# Patient Record
Sex: Male | Born: 1975 | Race: White | Hispanic: No | Marital: Married | Smoking: Never smoker
Health system: Southern US, Community
[De-identification: ages and names within clinical notes are randomized; demographics above are authoritative.]

---

## 1998-07-21 ENCOUNTER — Emergency Department (HOSPITAL_COMMUNITY): Admission: EM | Admit: 1998-07-21 | Discharge: 1998-07-21 | Payer: Self-pay | Admitting: Emergency Medicine

## 1998-07-21 ENCOUNTER — Encounter: Payer: Self-pay | Admitting: Emergency Medicine

## 1999-10-24 ENCOUNTER — Emergency Department (HOSPITAL_COMMUNITY): Admission: EM | Admit: 1999-10-24 | Discharge: 1999-10-24 | Payer: Self-pay | Admitting: Emergency Medicine

## 1999-10-24 ENCOUNTER — Encounter: Payer: Self-pay | Admitting: Emergency Medicine

## 2014-07-28 ENCOUNTER — Emergency Department (HOSPITAL_COMMUNITY): Payer: BC Managed Care – PPO

## 2014-07-28 ENCOUNTER — Encounter (HOSPITAL_COMMUNITY): Payer: Self-pay | Admitting: Emergency Medicine

## 2014-07-28 ENCOUNTER — Emergency Department (HOSPITAL_COMMUNITY)
Admission: EM | Admit: 2014-07-28 | Discharge: 2014-07-28 | Disposition: A | Discharge status: Home / Self Care | Payer: BC Managed Care – PPO | Attending: Emergency Medicine | Admitting: Emergency Medicine

## 2014-07-28 DIAGNOSIS — N508 Other specified disorders of male genital organs: Secondary | ICD-10-CM | POA: Insufficient documentation

## 2014-07-28 DIAGNOSIS — IMO0002 Reserved for concepts with insufficient information to code with codable children: Secondary | ICD-10-CM

## 2014-07-28 LAB — URINALYSIS, ROUTINE W REFLEX MICROSCOPIC
Bilirubin Urine: NEGATIVE
Glucose, UA: NEGATIVE mg/dL
Hgb urine dipstick: NEGATIVE
Ketones, ur: NEGATIVE mg/dL
Leukocytes, UA: NEGATIVE
Nitrite: NEGATIVE
Protein, ur: NEGATIVE mg/dL
Specific Gravity, Urine: 1.025 (ref 1.005–1.030)
Urobilinogen, UA: 0.2 mg/dL (ref 0.0–1.0)
pH: 5 (ref 5.0–8.0)

## 2014-07-28 MED ORDER — HYDROCODONE-ACETAMINOPHEN 5-325 MG PO TABS: 1.0000 | ORAL_TABLET | Freq: Four times a day (QID) | ORAL | Status: AC | PRN

## 2014-07-28 MED ORDER — HYDROMORPHONE HCL 1 MG/ML IJ SOLN
0.5000 mg | Freq: Once | INTRAMUSCULAR | Status: AC
  Administered 2014-07-28: 0.5 mg via INTRAVENOUS
  Filled 2014-07-28: qty 1

## 2014-07-28 MED ORDER — NAPROXEN 500 MG PO TABS: 500.0000 mg | ORAL_TABLET | Freq: Two times a day (BID) | ORAL | Status: AC

## 2014-07-28 MED ORDER — ONDANSETRON HCL 4 MG/2ML IJ SOLN
4.0000 mg | Freq: Once | INTRAMUSCULAR | Status: AC
  Administered 2014-07-28: 4 mg via INTRAVENOUS
  Filled 2014-07-28: qty 2

## 2014-07-28 NOTE — ED Provider Notes (Signed)
CSN: [id number]     Arrival date & time 07/28/14  1123 History   First MD Initiated Contact with Patient 07/28/14 1407     Chief Complaint  Patient presents with  . Testicle Pain    radiating to back     (Consider location/radiation/quality/duration/timing/severity/associated sxs/prior Treatment) Patient is a 38 y.o. male presenting with male genitourinary complaint. The history is provided by the patient. No language interpreter was used.  Male GU Problem Presenting symptoms: scrotal pain   Presenting symptoms: no dysuria, no penile discharge and no penile pain   Scrotal pain:    Affected testicle:  Both   Severity:  Moderate   Onset quality:  Sudden   Timing:  Intermittent   Progression:  Worsening   Chronicity:  New Context: not after injury, not after urination, not during intercourse and not during urination   Relieved by:  Elevation of scrotum Worsened by:  Nothing tried Ineffective treatments:  None tried Associated symptoms: no abdominal pain, no diarrhea, no fever, no flank pain, no hematuria, no nausea, no penile swelling, no urinary frequency and no vomiting   Risk factors: does not have multiple sexual partners and no STD exposure     History reviewed. No pertinent past medical history. History reviewed. No pertinent past surgical history. No family history on file. History  Substance Use Topics  . Smoking status: Never Smoker   . Smokeless tobacco: Not on file  . Alcohol Use: Yes     Comment: occ    Review of Systems  Constitutional: Negative for fever.  HENT: Negative for congestion, rhinorrhea and sore throat.   Respiratory: Negative for cough and shortness of breath.   Cardiovascular: Negative for chest pain.  Gastrointestinal: Negative for nausea, vomiting, abdominal pain and diarrhea.  Genitourinary: Positive for testicular pain. Negative for dysuria, frequency, hematuria, flank pain, discharge, penile swelling and penile pain.  Skin: Negative for  rash.  Neurological: Negative for syncope, light-headedness and headaches.  All other systems reviewed and are negative.     Allergies  Review of patient's allergies indicates no known allergies.  Home Medications   Prior to Admission medications   Not on File   BP 142/77  Pulse 98  Temp(Src) 99.3 F (37.4 C) (Oral)  Resp 16  Ht 5\' 6"  (1.676 m)  Wt 240 lb (108.863 kg)  BMI 38.76 kg/m2  SpO2 99% Physical Exam  Nursing note and vitals reviewed. Constitutional: He is oriented to person, place, and time. He appears well-developed and well-nourished.  HENT:  Head: Normocephalic and atraumatic.  Right Ear: External ear normal.  Left Ear: External ear normal.  Eyes: EOM are normal.  Neck: Normal range of motion. Neck supple.  Cardiovascular: Normal rate, regular rhythm and intact distal pulses.  Exam reveals no gallop and no friction rub.   No murmur heard. Pulmonary/Chest: Effort normal and breath sounds normal. No respiratory distress. He has no wheezes. He has no rales. He exhibits no tenderness.  Abdominal: Soft. Bowel sounds are normal. He exhibits no distension. There is no tenderness. There is no rebound.  Genitourinary:  Normal lie of testes, no hernia. No penile discharge or tenderness. Tenderness to lower pole of testes.  Musculoskeletal: Normal range of motion. He exhibits no edema and no tenderness.  Lymphadenopathy:    He has no cervical adenopathy.  Neurological: He is alert and oriented to person, place, and time.  Skin: Skin is warm. No rash noted.  Psychiatric: He has a normal mood and affect.  His behavior is normal.    ED Course  Procedures (including critical care time) Labs Review Labs Reviewed  URINALYSIS, ROUTINE W REFLEX MICROSCOPIC    Imaging Review No results found.   EKG Interpretation None      MDM   Final diagnoses:  None    2:25 PM Pt is a 38 y.o. male with no pertinent PMHX who presents to the ED with bilateral testicular  pain. Sudden onset worsening today while at work.  Has been off and on pain for the past week. Sharp in nature without radiation. No nausea, vomiting or diarrhea. No hematuria, no dysuria. No new sexual partners or recent sexual intercourse. No history of STDs. No urinary symptoms  On exam: well appearing, no hernia, no abdominal tenderness. No penile tenderness. No lymphadenopathy, normal lie of testes with normal cremasteric reflex. Concern for testicular torsion, possible intermittent testicular torsion. Plan to obtain UA and US scrotum to rule out torsion. Low suspicion for STD. Possible epididymitis  UA no evidence of UTI  Patient care transferred to Dr. Ladona Ridgelaylor pending results of US   Labs and imaging reviewed by myself and considered in medical decision making if ordered.  Imaging interpreted by radiology. Pt was discussed with my attending, Dr. Salli Realocherty     Kassy Mcenroe Peter Shanara Schnieders, MD 07/28/14 [phone]

## 2014-07-28 NOTE — ED Notes (Signed)
Pt c/o bil testicular pain that radiates to lower back.  Pt states pain increased today while shoveling.  Denies swelling, warmth or redness to area.  Denies penile discharge or changes in bowel or bladder habits.

## 2014-07-28 NOTE — ED Notes (Signed)
EDP with pt at this time 

## 2014-07-29 NOTE — ED Provider Notes (Signed)
Medical screening examination/treatment/procedure(s) were conducted as a shared visit with resident-physician practitioner(s) and myself.  I personally evaluated the patient during the encounter.  Pt is a 38 y.o. male with pmhx as above presenting with about one week of bilateral testicular pain, worse today..  Pt found to have normal cremasteric reflex, no appreciable edema or redness on my physical exam. No hernias. Bilateral testicular ultrasound will be ordered, but doubt torsion given the pain is bilateral..    Toy CookeyMegan Domonic Kimball, MD 07/29/14 1710

## 2015-02-02 ENCOUNTER — Ambulatory Visit
Admit: 2015-02-02 | Disposition: A | Discharge status: Home / Self Care | Payer: Self-pay | Attending: Family Medicine | Admitting: Family Medicine

## 2015-02-02 LAB — RAPID INFLUENZA A&B ANTIGENS (ARMC ONLY)

## 2018-06-04 DIAGNOSIS — R112 Nausea with vomiting, unspecified: Secondary | ICD-10-CM | POA: Diagnosis not present

## 2018-06-04 DIAGNOSIS — A498 Other bacterial infections of unspecified site: Secondary | ICD-10-CM | POA: Diagnosis not present

## 2018-06-04 DIAGNOSIS — R109 Unspecified abdominal pain: Secondary | ICD-10-CM | POA: Diagnosis not present

## 2018-06-04 DIAGNOSIS — D72829 Elevated white blood cell count, unspecified: Secondary | ICD-10-CM | POA: Diagnosis not present

## 2018-06-05 DIAGNOSIS — R109 Unspecified abdominal pain: Secondary | ICD-10-CM | POA: Diagnosis not present

## 2018-06-05 DIAGNOSIS — R112 Nausea with vomiting, unspecified: Secondary | ICD-10-CM | POA: Diagnosis not present

## 2018-06-05 DIAGNOSIS — D72829 Elevated white blood cell count, unspecified: Secondary | ICD-10-CM | POA: Diagnosis not present

## 2018-06-05 DIAGNOSIS — A498 Other bacterial infections of unspecified site: Secondary | ICD-10-CM | POA: Diagnosis not present

## 2018-08-19 ENCOUNTER — Emergency Department (HOSPITAL_COMMUNITY)
Admission: EM | Admit: 2018-08-19 | Discharge: 2018-08-19 | Disposition: A | Discharge status: Home / Self Care | Payer: BLUE CROSS/BLUE SHIELD | Attending: Emergency Medicine | Admitting: Emergency Medicine

## 2018-08-19 ENCOUNTER — Emergency Department (HOSPITAL_COMMUNITY): Payer: BLUE CROSS/BLUE SHIELD

## 2018-08-19 DIAGNOSIS — Y929 Unspecified place or not applicable: Secondary | ICD-10-CM | POA: Diagnosis not present

## 2018-08-19 DIAGNOSIS — Y990 Civilian activity done for income or pay: Secondary | ICD-10-CM | POA: Insufficient documentation

## 2018-08-19 DIAGNOSIS — S62667B Nondisplaced fracture of distal phalanx of left little finger, initial encounter for open fracture: Secondary | ICD-10-CM | POA: Insufficient documentation

## 2018-08-19 DIAGNOSIS — S67197A Crushing injury of left little finger, initial encounter: Secondary | ICD-10-CM | POA: Diagnosis present

## 2018-08-19 DIAGNOSIS — W319XXA Contact with unspecified machinery, initial encounter: Secondary | ICD-10-CM | POA: Insufficient documentation

## 2018-08-19 DIAGNOSIS — Y939 Activity, unspecified: Secondary | ICD-10-CM | POA: Diagnosis not present

## 2018-08-19 MED ORDER — SODIUM CHLORIDE 0.9 % IV BOLUS
500.0000 mL | Freq: Once | INTRAVENOUS | Status: AC
  Administered 2018-08-19: 500 mL via INTRAVENOUS

## 2018-08-19 MED ORDER — CEFAZOLIN SODIUM-DEXTROSE 2-4 GM/100ML-% IV SOLN
2.0000 g | Freq: Once | INTRAVENOUS | Status: AC
  Administered 2018-08-19: 2 g via INTRAVENOUS
  Filled 2018-08-19: qty 100

## 2018-08-19 MED ORDER — CEPHALEXIN 500 MG PO CAPS: 500.0000 mg | ORAL_CAPSULE | Freq: Four times a day (QID) | ORAL | 0 refills | Status: AC

## 2018-08-19 MED ORDER — OXYCODONE-ACETAMINOPHEN 5-325 MG PO TABS
1.0000 | ORAL_TABLET | Freq: Once | ORAL | Status: AC
  Administered 2018-08-19: 1 via ORAL
  Filled 2018-08-19: qty 1

## 2018-08-19 NOTE — ED Provider Notes (Signed)
Dalton Hodges [facility] EMERGENCY DEPARTMENT Provider Note   CSN: [id number] Arrival date & time: 08/19/18  1434     History   Chief Complaint Chief Complaint  Patient presents with  . Finger Injury    HPI Dalton Hodges is a 42 y.o. male presenting today for left fifth finger injury that occurred at work today.  Patient states that at 12:15 PM today his finger got caught in the machine at work crushing the tip of his left fifth finger tip.  Patient endorsed immediate sharp severe pain that has somewhat decreased in severity since onset.  Patient presented to the wellness center and received a Tdap shot prior to being sent to the emergency department for further treatment.  At this time patient endorsing a moderate intensity throbbing pain that is constant and worse with movement of the finger.  Patient denies any other injury today.  Patient states that he is an otherwise healthy 42 year old male, only daily medication use is oxycodone for previous right tricep injury.  HPI  No past medical history on file.  There are no active problems to display for this patient.   No past surgical history on file.      Home Medications    Prior to Admission medications   Medication Sig Start Date End Date Taking? Authorizing Provider  cephALEXin (KEFLEX) 500 MG capsule Take 1 capsule (500 mg total) by mouth 4 (four) times daily for 7 days. 08/19/18 08/26/18  Harlene Salts A, PA-C  HYDROcodone-acetaminophen (NORCO/VICODIN) 5-325 MG per tablet Take 1 tablet by mouth every 6 (six) hours as needed (for breakthrough pain). 07/28/14   Abagail Kitchens, MD  phentermine 37.5 MG capsule Take 37.5 mg by mouth every morning.    [provider]    Family History No family history on file.  Social History Social History   Tobacco Use  . Smoking status: Never Smoker  Substance Use Topics  . Alcohol use: Yes    Comment: occ  . Drug use: No     Allergies   Patient has  no known allergies.   Review of Systems Review of Systems  Constitutional: Negative.  Negative for chills and fever.  HENT: Negative.  Negative for congestion and rhinorrhea.   Eyes: Negative.  Negative for visual disturbance.  Respiratory: Negative.  Negative for shortness of breath.   Cardiovascular: Negative.  Negative for chest pain.  Gastrointestinal: Negative.  Negative for abdominal pain, nausea and vomiting.  Genitourinary: Negative.  Negative for dysuria.  Musculoskeletal: Positive for arthralgias. Negative for myalgias.       (Left finger DIP pain)  Skin: Positive for wound.  Neurological: Negative.  Negative for syncope, weakness and numbness.   Physical Exam Updated Vital Signs BP (!) 134/92 (BP Location: Right Arm)   Pulse 98   Temp 97.9 F (36.6 C)   Resp 18   SpO2 100%   Physical Exam  Constitutional: He appears well-developed and well-nourished. No distress.  HENT:  Head: Normocephalic and atraumatic.  Right Ear: External ear normal.  Left Ear: External ear normal.  Nose: Nose normal.  Eyes: Pupils are equal, round, and reactive to light. EOM are normal.  Neck: Trachea normal and normal range of motion. No tracheal deviation present.  Pulmonary/Chest: Effort normal. No respiratory distress.  Abdominal: Soft. There is no tenderness. There is no rebound and no guarding.  Musculoskeletal: Normal range of motion.       Right elbow: Normal.      Right  wrist: Normal.       Left forearm: Normal.       Left hand: He exhibits deformity. He exhibits normal range of motion and normal capillary refill. Normal sensation noted. Normal strength noted.  Left hand: With avulsion of left fourth fingertip.  No active bleeding. Tenderness to palpation directly over the wound.  No snuffbox tenderness to palpation. No tenderness to palpation over flexor sheath.  Finger adduction/abduction intact with 5/5 strength.  Thumb opposition intact. Full active and resisted ROM to  flexion/extension at wrist, MCP, PIP and DIP of all fingers.  Patient endorses increased pain with movement of the left fifth finger.   FDS/FDP intact. Grip 5/5 strength.  Radial artery 2+ with <2sec cap refill in all fingers.  Sensation intact to light-tough in median/ulnar/radial distributions.  Neurological: He is alert. GCS eye subscore is 4. GCS verbal subscore is 5. GCS motor subscore is 6.  Speech is clear and goal oriented, follows commands Major Cranial nerves without deficit, no facial droop Normal strength in upper and lower extremities bilaterally including dorsiflexion and plantar flexion, strong and equal grip strength increased pain with movement of left fifth finger. Sensation normal to light touch Moves extremities without ataxia, coordination intact Normal gait  Skin: Skin is warm and dry. Capillary refill takes less than 2 seconds.  Avulsion injury to left fifth finger, please see picture below.  Psychiatric: He has a normal mood and affect. His behavior is normal.         ED Treatments / Results  Labs (all labs ordered are listed, but only abnormal results are displayed) Labs Reviewed - No data to display  EKG None  Radiology Dg Finger Little Left  Result Date: 08/19/2018 CLINICAL DATA:  Machinery injury EXAM: LEFT LITTLE FINGER 2+V COMPARISON:  None. FINDINGS: Laceration of the medial aspect of the distal left little finger. There is an absent fragment of the tuft of the little finger. IMPRESSION: Soft tissue and osseous avulsion the medial aspect of the distal left little finger. Electronically Signed   By: Deatra Robinson M.D.   On: 08/19/2018 15:51    Procedures Procedures (including critical care time)  Medications Ordered in ED Medications  sodium chloride 0.9 % bolus 500 mL (500 mLs Intravenous New Bag/Given 08/19/18 1611)  oxyCODONE-acetaminophen (PERCOCET/ROXICET) 5-325 MG per tablet 1 tablet (1 tablet Oral Given 08/19/18 1519)  ceFAZolin (ANCEF)  IVPB 2g/100 mL premix (0 g Intravenous Stopped 08/19/18 1642)     Initial Impression / Assessment and Plan / ED Course  I have reviewed the triage vital signs and the nursing notes.  Pertinent labs & imaging results that were available during my care of the patient were reviewed by me and considered in my medical decision making (see chart for details).  Clinical Course as of Aug 20 1707  Wed Aug 19, 2018  1551 Discussed case with hand ortho Charma Igo PA-C in emergency department.  Who has reviewed imaging and seen patient.  Advises 2 g of Ancef, Dr. Janee Morn to see patient.   [BM]  1604 Discussion with Margaretha Glassing; Dr. Janee Morn advises cover wound with Xeroform gauze and outpatient follow-up with his office. Finish 2g of Ancef here in department and d/c with keflex.   [BM]    Clinical Course User Index [BM] Bill Salinas, PA-C   42 year old otherwise healthy male presents today for open nondisplaced fracture of the distal phalanx of the left fifth finger.  Consult to hand as above, seen and evaluated  by Earney HamburgMichael Jeffrey PA-C, patient given 2 g of Ancef, discharged with Keflex and follow-up outpatient on Tuesday with hand surgeon Dr. Carollee Massedhompson's office.  Patient already with home narcotic prescription of oxycodone, will not give additional narcotic prescription today.  Ice and elevation encouraged.  Pain controlled in emergency department with Percocet.  Wound cleaned thoroughly with 1 L of sterile saline and soap/water.  Covered with Xeroform gauze/antibiotic ointment.  Tdap given prior to arrival.  Patient states understanding to take Keflex as prescribed follow-up with his Tuesday.  Patient informed to only take his home narcotic medication as prescribed by his provider.  At this time there does not appear to be any evidence of an acute emergency medical condition and the patient appears stable for discharge with appropriate outpatient follow up. Diagnosis was discussed with  patient who verbalizes understanding of care plan and is agreeable to discharge. I have discussed return precautions with patient who verbalizes understanding of return precautions. Patient strongly encouraged to go to Dr. Carollee Massedhompson's office on Tuesday and follow-up with his primary care provider next week. All questions answered.   Note: Portions of this report may have been transcribed using voice recognition software. Every effort was made to ensure accuracy; however, inadvertent computerized transcription errors may still be present. Final Clinical Impressions(s) / ED Diagnoses   Final diagnoses:  Open nondisplaced fracture of distal phalanx of left little finger, initial encounter    ED Discharge Orders         Ordered    cephALEXin (KEFLEX) 500 MG capsule  4 times daily     08/19/18 1642           Elizabeth PalauMorelli, Jacqulyn Barresi A, PA-C 08/19/18 1718    Gwyneth SproutPlunkett, Whitney, MD 08/25/18 1536

## 2018-08-19 NOTE — Discharge Instructions (Addendum)
You have been diagnosed today with open nondisplaced fracture of the distal phalanx of the left middle finger.  At this time there does not appear to be the presence of an emergent medical condition, however there is always the potential for conditions to change. Please read and follow the below instructions.  Please return to the Emergency Department immediately for any new or worsening symptoms or if your symptoms do not improve. Please be sure to follow up with your Primary Care Provider for next week regarding your visit today; please call their office to schedule an appointment even if you are feeling better for a follow-up visit. As discussed with the orthopedic specialist, you have a follow-up appointment with their office on Tuesday.  You must go to this appointment. Please take the antibiotic Keflex as prescribed for infection prevention. Due to your current prescription of pain medicine that you received already I am unable to prescribe additional narcotic medication at this time.  Only use your home oxycodone as prescribed by your primary care provider.  You may also use ice and elevation to help with your pain.  Get help right away if: You have a red streak going away from your wound. The edges of the wound open up and separate. Your wound is bleeding and the bleeding does not stop with gentle pressure. You have a rash. You faint. You have trouble breathing. Your finger gets numb or turns blue. You have more redness, swelling, or pain around the wound. You have more fluid or blood coming from the wound. Your wound feels warm to the touch. You have pus or a bad smell coming from the wound. You have a fever or chills. You are nauseous or you vomit. You are dizzy.  Please read the additional information packets attached to your discharge summary.  Do not take your medicine if  develop an itchy rash, swelling in your mouth or lips, or difficulty breathing.

## 2018-08-19 NOTE — Consult Note (Addendum)
Reason for Consult:Left little finger injury Referring Physician: Clance BollW Plunkett  Detrick Patrick NorthJoe Hodges is an 42 y.o. male.  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 He had immediate pain. He came to the ED for evaluation. X-rays showed a ND distal phalanx fx and given the crush and nail bed involvement hand surgery was consulted. He is RHD.  No past medical history on file.  No past surgical history on file.  No family history on file.  Social History:  reports that he has never smoked. He does not have any smokeless tobacco history on file. He reports that he drinks alcohol. He reports that he does not use drugs.  Allergies: No Known Allergies  Medications: I have reviewed the patient's current medications.  No results found for this or any previous visit (from the past 48 hour(s)).  No results found.  Review of Systems  Constitutional: Negative for weight loss.  HENT: Negative for ear discharge, ear pain, hearing loss and tinnitus.   Eyes: Negative for blurred vision, double vision, photophobia and pain.  Respiratory: Negative for cough, sputum production and shortness of breath.   Cardiovascular: Negative for chest pain.  Gastrointestinal: Negative for abdominal pain, nausea and vomiting.  Genitourinary: Negative for dysuria, flank pain, frequency and urgency.  Musculoskeletal: Positive for joint pain (Left little finger). Negative for back pain, falls, myalgias and neck pain.  Neurological: Negative for dizziness, tingling, sensory change, focal weakness, loss of consciousness and headaches.  Endo/Heme/Allergies: Does not bruise/bleed easily.  Psychiatric/Behavioral: Negative for depression, memory loss and substance abuse. The patient is not nervous/anxious.    Blood pressure (!) 148/98, pulse (!) 101, temperature 97.9 F (36.6 C), resp. rate 16, SpO2 97 %. Physical Exam  Constitutional: He  appears well-developed and well-nourished. No distress.  HENT:  Head: Normocephalic and atraumatic.  Eyes: Conjunctivae are normal. Right eye exhibits no discharge. Left eye exhibits no discharge. No scleral icterus.  Neck: Normal range of motion.  Cardiovascular: Normal rate and regular rhythm.  Respiratory: Effort normal. No respiratory distress.  Musculoskeletal:  Left shoulder, elbow, wrist, digits- Laceration ulnar tip of little finger involving ~40% nail bed, flex/ext DIP joint intact, paresthetic ulnar tip, no instability, no blocks to motion  Sens  Ax/R/M/U intact  Mot   Ax/ R/ PIN/ M/ AIN/ U intact  Rad 2+  Neurological: He is alert.  Skin: Skin is warm and dry. He is not diaphoretic.  Psychiatric: He has a normal mood and affect. His behavior is normal.    Assessment/Plan: Left little finger crush injury -- Not much left to close, just tissue loss. Will place Xeroform dressing and have pt f/u with Dalton Hodges in office within a week.    Dalton CaldronMichael J. Jeffery, PA-C Orthopedic Surgery [phone] 08/19/2018, 3:53 PM   L SF crush injury, with apparent ST loss, and ND tuft fx.  Recommend application of xeroform dressing, etc. Office will call patient tomorrow to make appt for Tuesday.  Dalton Crouchave [name], MD Hand Surgery

## 2018-08-19 NOTE — ED Triage Notes (Signed)
Pt smashed left pinky finger in device at work, pt sent from wellness center where he received tetanus shot. Bleeding controlled

## 2018-08-19 NOTE — ED Notes (Signed)
Patient verbalizes understanding of discharge instructions. Opportunity for questioning and answers were provided. Armband removed by staff, pt discharged from ED ambulatory.   

## 2018-08-19 NOTE — ED Notes (Signed)
Patient transported to X-ray 

## 2021-07-19 ENCOUNTER — Emergency Department (HOSPITAL_COMMUNITY)
Admission: EM | Admit: 2021-07-19 | Discharge: 2021-07-20 | Disposition: A | Discharge status: Home / Self Care | Payer: No Typology Code available for payment source | Attending: Emergency Medicine | Admitting: Emergency Medicine

## 2021-07-19 ENCOUNTER — Emergency Department (HOSPITAL_COMMUNITY): Payer: No Typology Code available for payment source

## 2021-07-19 DIAGNOSIS — S3991XA Unspecified injury of abdomen, initial encounter: Secondary | ICD-10-CM | POA: Diagnosis not present

## 2021-07-19 DIAGNOSIS — Y9389 Activity, other specified: Secondary | ICD-10-CM | POA: Diagnosis not present

## 2021-07-19 DIAGNOSIS — S0990XA Unspecified injury of head, initial encounter: Secondary | ICD-10-CM | POA: Diagnosis present

## 2021-07-19 DIAGNOSIS — Y92410 Unspecified street and highway as the place of occurrence of the external cause: Secondary | ICD-10-CM | POA: Insufficient documentation

## 2021-07-19 DIAGNOSIS — S299XXA Unspecified injury of thorax, initial encounter: Secondary | ICD-10-CM | POA: Insufficient documentation

## 2021-07-19 DIAGNOSIS — I4510 Unspecified right bundle-branch block: Secondary | ICD-10-CM | POA: Insufficient documentation

## 2021-07-19 DIAGNOSIS — S0093XA Contusion of unspecified part of head, initial encounter: Secondary | ICD-10-CM

## 2021-07-19 DIAGNOSIS — S301XXA Contusion of abdominal wall, initial encounter: Secondary | ICD-10-CM

## 2021-07-19 LAB — I-STAT CHEM 8, ED
BUN: 16 mg/dL (ref 6–20)
Calcium, Ion: 1.11 mmol/L — ABNORMAL LOW (ref 1.15–1.40)
Chloride: 102 mmol/L (ref 98–111)
Creatinine, Ser: 1.2 mg/dL (ref 0.61–1.24)
Glucose, Bld: 107 mg/dL — ABNORMAL HIGH (ref 70–99)
HCT: 44 % (ref 39.0–52.0)
Hemoglobin: 15 g/dL (ref 13.0–17.0)
Potassium: 4.2 mmol/L (ref 3.5–5.1)
Sodium: 137 mmol/L (ref 135–145)
TCO2: 27 mmol/L (ref 22–32)

## 2021-07-19 LAB — SAMPLE TO BLOOD BANK

## 2021-07-19 MED ORDER — IOHEXOL 350 MG/ML SOLN
100.0000 mL | Freq: Once | INTRAVENOUS | Status: AC | PRN
  Administered 2021-07-19: 100 mL via INTRAVENOUS

## 2021-07-19 MED ORDER — ONDANSETRON HCL 4 MG/2ML IJ SOLN
4.0000 mg | Freq: Once | INTRAMUSCULAR | Status: AC
  Administered 2021-07-19: 4 mg via INTRAVENOUS
  Filled 2021-07-19: qty 2

## 2021-07-19 MED ORDER — HYDROMORPHONE HCL 1 MG/ML IJ SOLN
1.0000 mg | Freq: Once | INTRAMUSCULAR | Status: AC
  Administered 2021-07-19: 1 mg via INTRAVENOUS
  Filled 2021-07-19: qty 1
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
## 2021-07-19 NOTE — ED Notes (Signed)
Patient transported to CT 

## 2021-07-19 NOTE — Discharge Instructions (Addendum)
It was our pleasure to provide your ER care today - we hope that you feel better.  Take acetaminophen or ibuprofen as need.   Return to ER if worse, new symptoms, fevers, new/severe pain, trouble breathing, or other concern.   You were given pain meds in the ER - no driving for the next 6 hours.   Addition you can take your chronic pain medicines that you have at home as needed.
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
## 2021-07-20 DIAGNOSIS — G894 Chronic pain syndrome: Secondary | ICD-10-CM | POA: Insufficient documentation

## 2021-07-20 DIAGNOSIS — E119 Type 2 diabetes mellitus without complications: Secondary | ICD-10-CM | POA: Insufficient documentation

## 2021-07-20 DIAGNOSIS — E039 Hypothyroidism, unspecified: Secondary | ICD-10-CM | POA: Insufficient documentation

## 2021-07-20 DIAGNOSIS — I10 Essential (primary) hypertension: Secondary | ICD-10-CM | POA: Insufficient documentation

## 2021-07-20 LAB — URINALYSIS, ROUTINE W REFLEX MICROSCOPIC
Bacteria, UA: NONE SEEN
Bilirubin Urine: NEGATIVE
Glucose, UA: NEGATIVE mg/dL
Ketones, ur: NEGATIVE mg/dL
Leukocytes,Ua: NEGATIVE
Nitrite: NEGATIVE
Protein, ur: NEGATIVE mg/dL
Specific Gravity, Urine: 1.043 — ABNORMAL HIGH (ref 1.005–1.030)
pH: 5 (ref 5.0–8.0)

## 2021-07-20 LAB — CBC WITH DIFFERENTIAL/PLATELET
Abs Immature Granulocytes: 0.03 10*3/uL (ref 0.00–0.07)
Basophils Absolute: 0.1 10*3/uL (ref 0.0–0.1)
Basophils Relative: 1 %
Eosinophils Absolute: 0 10*3/uL (ref 0.0–0.5)
Eosinophils Relative: 0 %
HCT: 43.6 % (ref 39.0–52.0)
Hemoglobin: 14.8 g/dL (ref 13.0–17.0)
Immature Granulocytes: 0 %
Lymphocytes Relative: 29 %
Lymphs Abs: 2.5 10*3/uL (ref 0.7–4.0)
MCH: 30.4 pg (ref 26.0–34.0)
MCHC: 33.9 g/dL (ref 30.0–36.0)
MCV: 89.5 fL (ref 80.0–100.0)
Monocytes Absolute: 0.8 10*3/uL (ref 0.1–1.0)
Monocytes Relative: 9 %
Neutro Abs: 5.4 10*3/uL (ref 1.7–7.7)
Neutrophils Relative %: 61 %
Platelets: 240 10*3/uL (ref 150–400)
RBC: 4.87 MIL/uL (ref 4.22–5.81)
RDW: 12.7 % (ref 11.5–15.5)
WBC: 8.8 10*3/uL (ref 4.0–10.5)
nRBC: 0 % (ref 0.0–0.2)

## 2021-07-20 LAB — BASIC METABOLIC PANEL
Anion gap: 6 (ref 5–15)
BUN: 15 mg/dL (ref 6–20)
CO2: 27 mmol/L (ref 22–32)
Calcium: 8.7 mg/dL — ABNORMAL LOW (ref 8.9–10.3)
Chloride: 104 mmol/L (ref 98–111)
Creatinine, Ser: 1.31 mg/dL — ABNORMAL HIGH (ref 0.61–1.24)
GFR, Estimated: >60 mL/min (ref >60)
Glucose, Bld: 101 mg/dL — ABNORMAL HIGH (ref 70–99)
Potassium: 4 mmol/L (ref 3.5–5.1)
Sodium: 137 mmol/L (ref 135–145)

## 2021-07-20 LAB — CK: Total CK: 207 U/L (ref 49–397)

## 2021-07-20 MED ORDER — HYDROMORPHONE HCL 1 MG/ML IJ SOLN
1.0000 mg | Freq: Once | INTRAMUSCULAR | Status: AC
  Administered 2021-07-20: 1 mg via INTRAVENOUS
  Filled 2021-07-20: qty 1

## 2021-07-20 MED ORDER — KETOROLAC TROMETHAMINE 30 MG/ML IJ SOLN
30.0000 mg | Freq: Once | INTRAMUSCULAR | Status: AC
  Administered 2021-07-20: 30 mg via INTRAVENOUS
  Filled 2021-07-20: qty 1

## 2021-07-20 MED ORDER — HYDROMORPHONE HCL 1 MG/ML IJ SOLN
1.0000 mg | Freq: Once | INTRAMUSCULAR | Status: AC
Start: 2021-07-20 — End: 2021-07-20
  Administered 2021-07-20: 1 mg via INTRAVENOUS
  Filled 2021-07-20: qty 1
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
## 2021-07-20 NOTE — ED Notes (Signed)
Pt was assisted to a sitting position however, unable to ambulate due to pain in the pelvis area. The nurse was made aware.
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
